# Patient Record
Sex: Male | Born: 1985 | Race: White | Hispanic: Yes | Marital: Married | Smoking: Current some day smoker
Health system: Southern US, Community
[De-identification: ages and names within clinical notes are randomized; demographics above are authoritative.]

## PROBLEM LIST (undated history)

## (undated) DIAGNOSIS — W3400XA Accidental discharge from unspecified firearms or gun, initial encounter: Secondary | ICD-10-CM

## (undated) DIAGNOSIS — T148XXA Other injury of unspecified body region, initial encounter: Secondary | ICD-10-CM

---

## 2007-05-20 ENCOUNTER — Inpatient Hospital Stay (HOSPITAL_COMMUNITY): Admission: EM | Admit: 2007-05-20 | Discharge: 2007-05-22 | Payer: Self-pay

## 2007-06-23 ENCOUNTER — Emergency Department (HOSPITAL_COMMUNITY): Admission: EM | Admit: 2007-06-23 | Discharge: 2007-06-23 | Payer: Self-pay | Admitting: *Deleted
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
## 2011-04-13 LAB — URINALYSIS, ROUTINE W REFLEX MICROSCOPIC
Bilirubin Urine: NEGATIVE
Glucose, UA: NEGATIVE
Hgb urine dipstick: NEGATIVE
Ketones, ur: NEGATIVE
Nitrite: NEGATIVE
Protein, ur: NEGATIVE
Specific Gravity, Urine: >1.046 — ABNORMAL HIGH
Urobilinogen, UA: 0.2
pH: 5.5

## 2011-04-13 LAB — TYPE AND SCREEN: Antibody Screen: NEGATIVE

## 2011-04-13 LAB — POCT I-STAT CREATININE: Creatinine, Ser: 1.1

## 2011-04-13 LAB — CBC
HCT: 44
HCT: 48.8
MCHC: 34.7
MCV: 88.2
MCV: 89.3
Platelets: 242
Platelets: 252
RBC: 5.53
WBC: 12.5 — ABNORMAL HIGH

## 2011-04-13 LAB — I-STAT 8, (EC8 V) (CONVERTED LAB)
Acid-base deficit: 3 — ABNORMAL HIGH
Bicarbonate: 21.2
HCT: 53 — ABNORMAL HIGH
Operator id: 284251
Sodium: 140
TCO2: 22

## 2011-04-13 LAB — BASIC METABOLIC PANEL
Chloride: 104
Sodium: 139

## 2011-04-13 LAB — ABO/RH: ABO/RH(D): O POS

## 2012-10-03 ENCOUNTER — Emergency Department (HOSPITAL_COMMUNITY)
Admission: EM | Admit: 2012-10-03 | Discharge: 2012-10-03 | Disposition: A | Discharge status: Home / Self Care | Payer: Self-pay | Attending: Emergency Medicine | Admitting: Emergency Medicine

## 2012-10-03 ENCOUNTER — Encounter (HOSPITAL_COMMUNITY): Payer: Self-pay | Admitting: Emergency Medicine

## 2012-10-03 DIAGNOSIS — Z87828 Personal history of other (healed) physical injury and trauma: Secondary | ICD-10-CM | POA: Insufficient documentation

## 2012-10-03 DIAGNOSIS — F10929 Alcohol use, unspecified with intoxication, unspecified: Secondary | ICD-10-CM

## 2012-10-03 DIAGNOSIS — F1010 Alcohol abuse, uncomplicated: Secondary | ICD-10-CM | POA: Insufficient documentation

## 2012-10-03 HISTORY — DX: Other injury of unspecified body region, initial encounter: T14.8XXA

## 2012-10-03 HISTORY — DX: Accidental discharge from unspecified firearms or gun, initial encounter: W34.00XA

## 2012-10-03 LAB — BASIC METABOLIC PANEL
Calcium: 9.1 mg/dL (ref 8.4–10.5)
Chloride: 108 mEq/L (ref 96–112)
Creatinine, Ser: 0.57 mg/dL (ref 0.50–1.35)
GFR calc Af Amer: >90 mL/min (ref >90)
GFR calc non Af Amer: >90 mL/min (ref >90)
Sodium: 144 mEq/L (ref 135–145)

## 2012-10-03 LAB — CBC
HCT: 43.9 % (ref 39.0–52.0)
Hemoglobin: 14.8 g/dL (ref 13.0–17.0)
MCH: 31.3 pg (ref 26.0–34.0)
MCHC: 33.7 g/dL (ref 30.0–36.0)
MCV: 92.8 fL (ref 78.0–100.0)
Platelets: 361 10*3/uL (ref 150–400)
RDW: 13.7 % (ref 11.5–15.5)
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
## 2012-10-03 NOTE — ED Provider Notes (Signed)
Medical screening examination/treatment/procedure(s) were conducted as a shared visit with non-physician practitioner(s) and myself.  I personally evaluated the patient during the encounter  Pt is ambulating without assistance. No slurred speech or clinical signs of intoxication.   Loren Racer, MD 10/03/12 2312
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
## 2012-10-03 NOTE — ED Notes (Signed)
MD at bedside. 
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
## 2012-10-03 NOTE — ED Notes (Signed)
IV taken out, put in by EMS.

## 2012-10-03 NOTE — ED Notes (Signed)
[id number]<AV> Expected date:<BR> Expected time:<BR> Means of arrival:<BR> Comments:<BR> ETOH
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
## 2012-10-03 NOTE — ED Notes (Signed)
Pt lying supine. Breaths even/unlabored. No acute distress.

## 2012-10-03 NOTE — Progress Notes (Signed)
Pt flagged Cm in hallway to speak with a male on his cell phone to tell male address to hospital [address] Male states he will be at hospital in 5-10 minutes and will call pt back. ED RN updated  Cm noted Jeffery Patterson,Jeffery Patterson   [phone] listed in demographics Male answered and stated CM had wrong number when CM request to speak with Jeffery Patterson for Jeffery Patterson.

## 2018-02-09 ENCOUNTER — Emergency Department (HOSPITAL_COMMUNITY): Payer: Self-pay

## 2018-02-09 ENCOUNTER — Emergency Department (HOSPITAL_COMMUNITY)
Admission: EM | Admit: 2018-02-09 | Discharge: 2018-02-10 | Disposition: A | Discharge status: Home / Self Care | Payer: Self-pay | Attending: Emergency Medicine | Admitting: Emergency Medicine

## 2018-02-09 ENCOUNTER — Other Ambulatory Visit: Payer: Self-pay

## 2018-02-09 DIAGNOSIS — R0789 Other chest pain: Secondary | ICD-10-CM | POA: Insufficient documentation

## 2018-02-09 DIAGNOSIS — R079 Chest pain, unspecified: Secondary | ICD-10-CM

## 2018-02-09 DIAGNOSIS — J029 Acute pharyngitis, unspecified: Secondary | ICD-10-CM | POA: Insufficient documentation

## 2018-02-09 DIAGNOSIS — M542 Cervicalgia: Secondary | ICD-10-CM | POA: Insufficient documentation

## 2018-02-09 LAB — COMPREHENSIVE METABOLIC PANEL
ALBUMIN: 4.2 g/dL (ref 3.5–5.0)
ALT: 47 U/L — ABNORMAL HIGH (ref 0–44)
ANION GAP: 14 (ref 5–15)
AST: 34 U/L (ref 15–41)
Alkaline Phosphatase: 47 U/L (ref 38–126)
BUN: 6 mg/dL (ref 6–20)
CHLORIDE: 103 mmol/L (ref 98–111)
CO2: 22 mmol/L (ref 22–32)
Calcium: 9.2 mg/dL (ref 8.9–10.3)
Creatinine, Ser: 0.8 mg/dL (ref 0.61–1.24)
GFR calc non Af Amer: >60 mL/min (ref >60)
GLUCOSE: 104 mg/dL — AB (ref 70–99)
Potassium: 3.6 mmol/L (ref 3.5–5.1)
Sodium: 139 mmol/L (ref 135–145)
Total Bilirubin: 1.2 mg/dL (ref 0.3–1.2)
Total Protein: 6.9 g/dL (ref 6.5–8.1)

## 2018-02-09 LAB — CBC
HEMATOCRIT: 45.3 % (ref 39.0–52.0)
HEMOGLOBIN: 15.1 g/dL (ref 13.0–17.0)
MCH: 31.3 pg (ref 26.0–34.0)
MCHC: 33.3 g/dL (ref 30.0–36.0)
MCV: 94 fL (ref 78.0–100.0)
Platelets: 280 10*3/uL (ref 150–400)
RBC: 4.82 MIL/uL (ref 4.22–5.81)
RDW: 11.9 % (ref 11.5–15.5)
WBC: 17.3 10*3/uL — ABNORMAL HIGH (ref 4.0–10.5)

## 2018-02-09 LAB — URINALYSIS, ROUTINE W REFLEX MICROSCOPIC
Bilirubin Urine: NEGATIVE
GLUCOSE, UA: NEGATIVE mg/dL
Hgb urine dipstick: NEGATIVE
Ketones, ur: NEGATIVE mg/dL
Leukocytes, UA: NEGATIVE
Nitrite: NEGATIVE
PH: 7 (ref 5.0–8.0)
Protein, ur: NEGATIVE mg/dL
SPECIFIC GRAVITY, URINE: 1.002 — AB (ref 1.005–1.030)

## 2018-02-09 LAB — I-STAT CG4 LACTIC ACID, ED: Lactic Acid, Venous: 1.84 mmol/L (ref 0.5–1.9)

## 2018-02-09 LAB — I-STAT TROPONIN, ED: Troponin i, poc: 0 ng/mL (ref 0.00–0.08)

## 2018-02-09 LAB — LIPASE, BLOOD: LIPASE: 28 U/L (ref 11–51)

## 2018-02-09 MED ORDER — SODIUM CHLORIDE 0.9 % IV BOLUS
1000.0000 mL | Freq: Once | INTRAVENOUS | Status: AC
  Administered 2018-02-09: 1000 mL via INTRAVENOUS

## 2018-02-09 MED ORDER — ACETAMINOPHEN 325 MG PO TABS
650.0000 mg | ORAL_TABLET | Freq: Once | ORAL | Status: AC
  Administered 2018-02-09: 650 mg via ORAL
  Filled 2018-02-09: qty 2

## 2018-02-09 MED ORDER — IOHEXOL 300 MG/ML  SOLN
100.0000 mL | Freq: Once | INTRAMUSCULAR | Status: AC | PRN
  Administered 2018-02-09: 100 mL via INTRAVENOUS

## 2018-02-09 NOTE — ED Notes (Signed)
Patient transported to CT 

## 2018-02-09 NOTE — ED Notes (Signed)
Writer sent down urine culture with U/A

## 2018-02-09 NOTE — ED Triage Notes (Signed)
Pt speaks Spanish. Pt reports pain to R jaw radiating to head. Pt reports vomiting for 2 days. Pt feels weak. Pt denies abdominal pain. Pt reports loss of appetite.

## 2018-02-09 NOTE — ED Notes (Signed)
Pt up to desk, states she is feeling more weak and dizzy.  Due to patient's elevated WBC, will prioritize rooming for patient.
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
## 2018-02-09 NOTE — ED Notes (Signed)
ED Provider at bedside. 

## 2018-02-10 LAB — D-DIMER, QUANTITATIVE (NOT AT ARMC)

## 2018-02-10 LAB — I-STAT TROPONIN, ED: TROPONIN I, POC: 0 ng/mL (ref 0.00–0.08)

## 2018-02-10 LAB — GROUP A STREP BY PCR: GROUP A STREP BY PCR: NOT DETECTED

## 2018-02-10 MED ORDER — CLINDAMYCIN HCL 150 MG PO CAPS: 150.0000 mg | ORAL_CAPSULE | Freq: Four times a day (QID) | ORAL | 0 refills | Status: AC

## 2018-02-10 MED ORDER — CLINDAMYCIN HCL 150 MG PO CAPS
300.0000 mg | ORAL_CAPSULE | Freq: Once | ORAL | Status: AC
  Administered 2018-02-10: 300 mg via ORAL
  Filled 2018-02-10: qty 2

## 2018-02-10 NOTE — ED Notes (Signed)
ED Provider at bedside. 

## 2020-03-21 IMAGING — CT CT NECK W/ CM
4 series · 15 of 33 positions shown, 18 images · IV contrast (Omni 300)
Comparison: None.

CLINICAL DATA: Vomiting and right-sided jaw pain radiating to the
head.

EXAM:
CT NECK WITH CONTRAST
TECHNIQUE: Multidetector CT imaging of the neck was performed using the
standard protocol following the bolus administration of intravenous
contrast.
CONTRAST:  100mL OMNIPAQUE IOHEXOL 300 MG/ML  SOLN

[Series 3: neck 2.0 st · axial · 0.46mm/px · z∈[-297,-109]mm · 5 of 142 slices shown, 7 images (1 of 3)]
[im 24/142  soft-tissue]
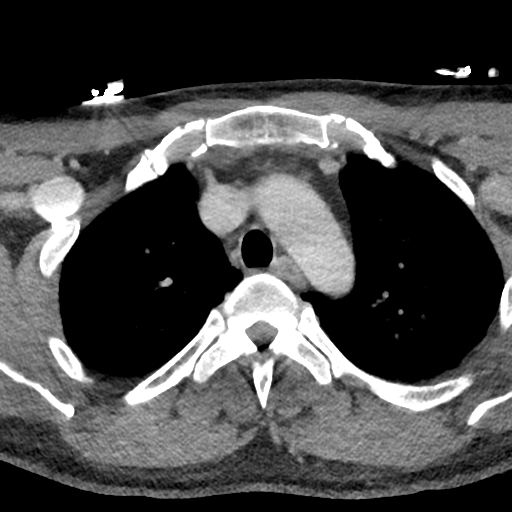
[im 24/142  bone]
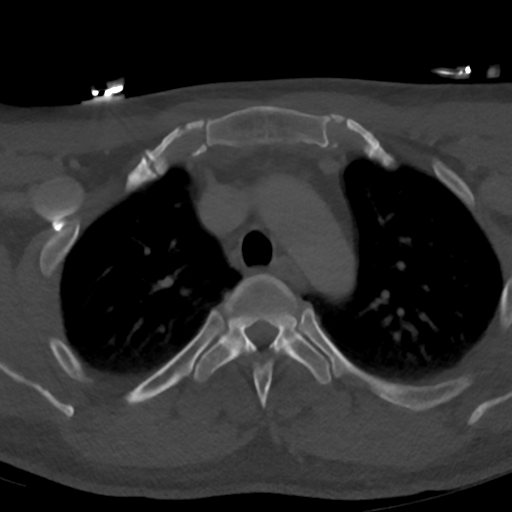
[im 48/142  bone]
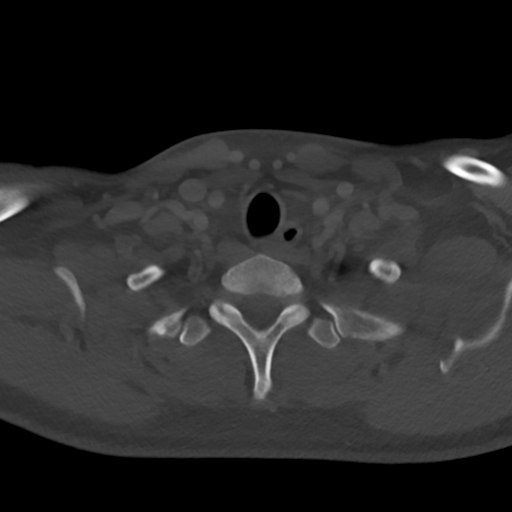
[im 71/142  bone]
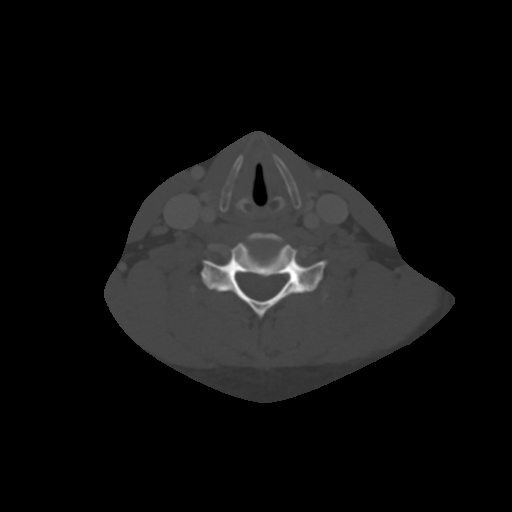
[im 95/142  bone]
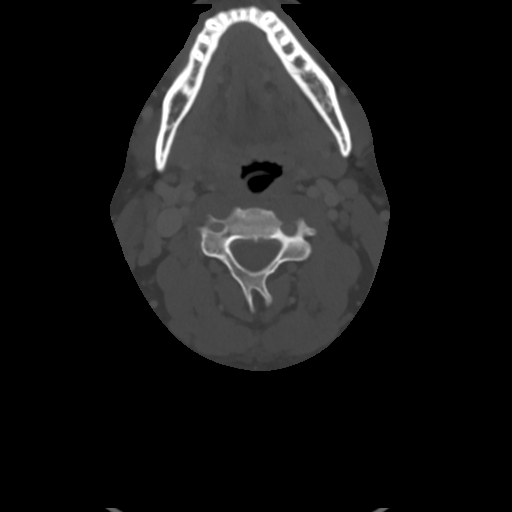
[im 118/142  soft-tissue]
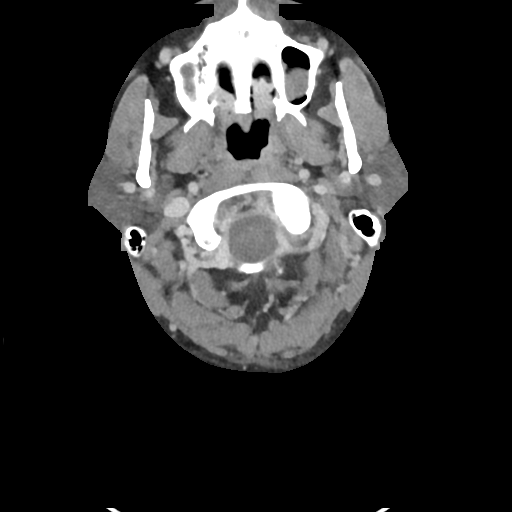
[im 118/142  bone]
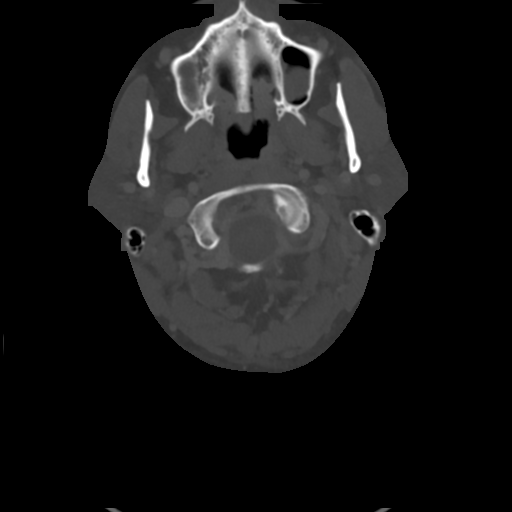

[Series 5: neck 2.0 st · sagittal · 0.55mm/px · 5 of 101 slices shown, 6 images (2 of 3)]
[im 34/101  bone]
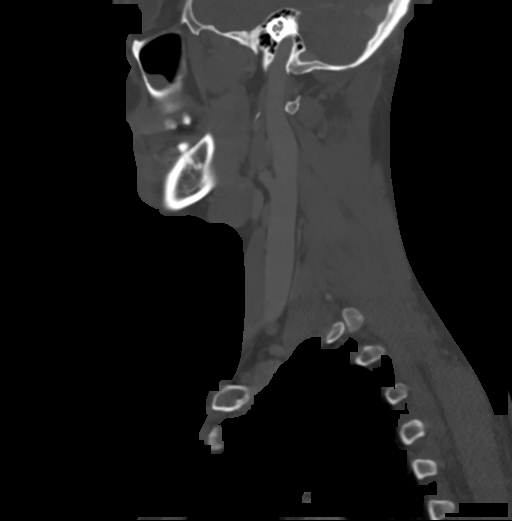
[im 42/101  bone]
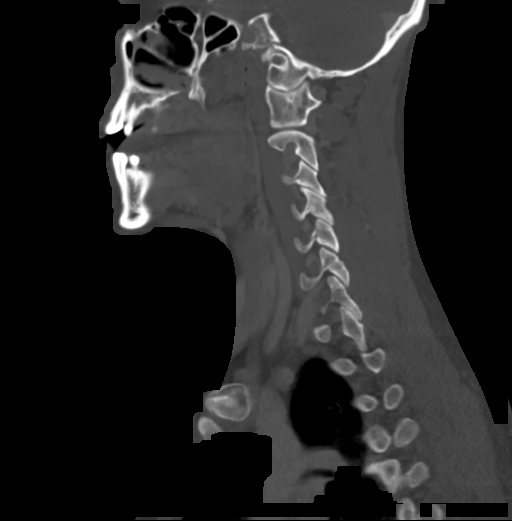
[im 51/101  soft-tissue]
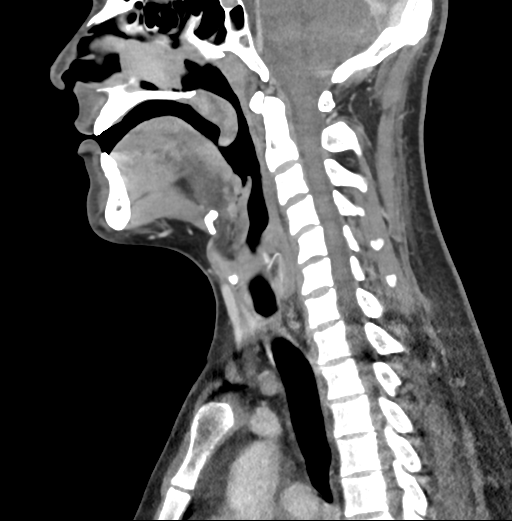
[im 51/101  bone]
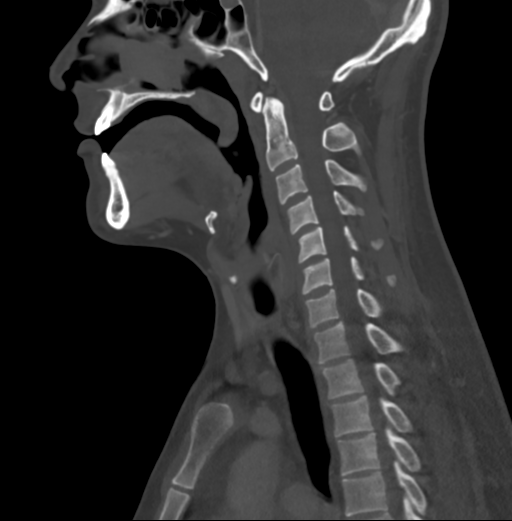
[im 59/101  bone]
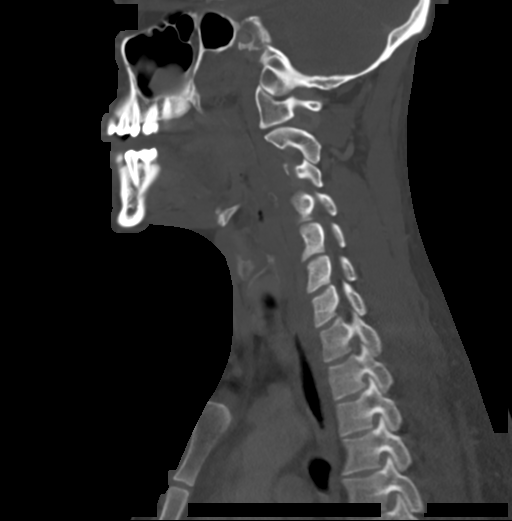
[im 67/101  bone]
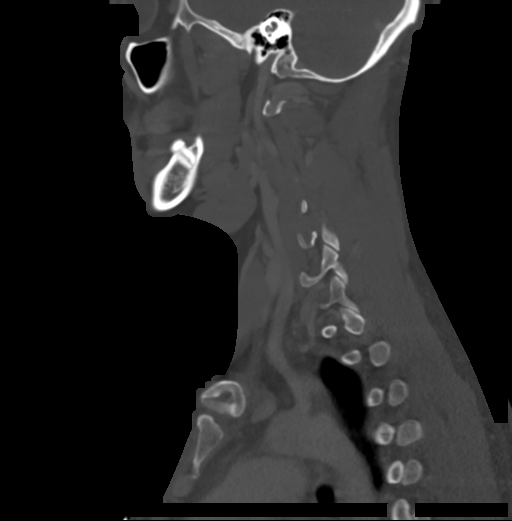

[Series 6: neck 2.0 st · coronal · 0.56mm/px · 3 of 124 slices shown (3 of 3)]
[im 25/124  bone]
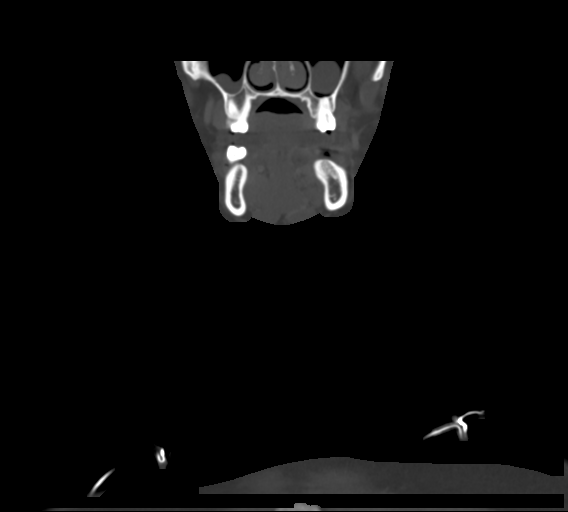
[im 50/124  bone]
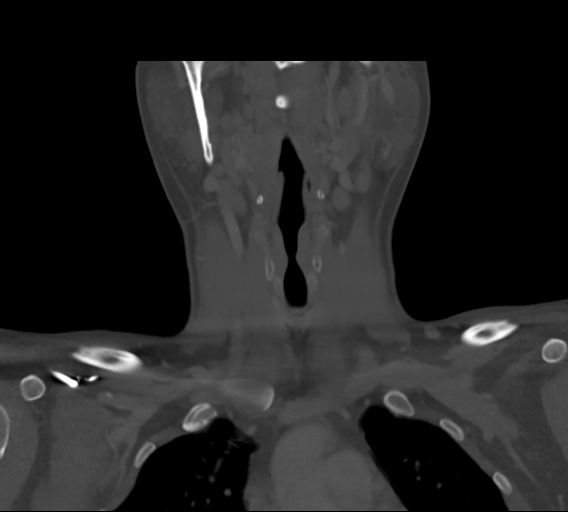
[im 74/124  bone]
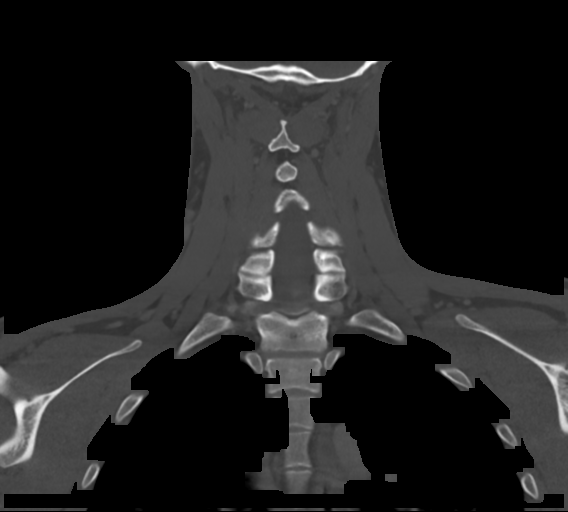

[Series 7: neck 2.0 st orthogonal · axial · 0.39mm/px · z∈[-291,-245]mm · 2 of 141 slices shown]
[im 24/141  bone]
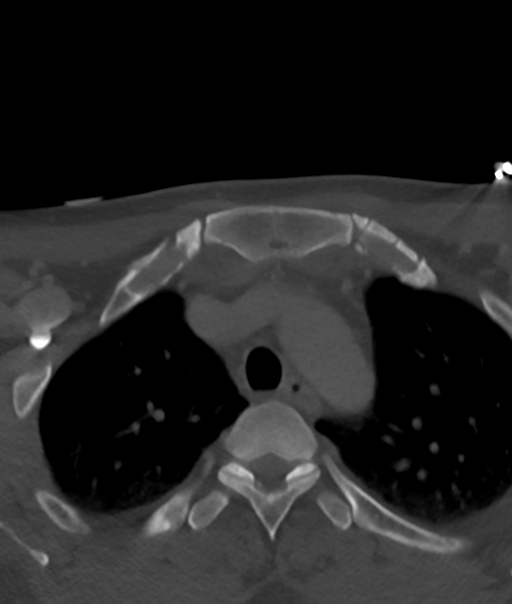
[im 47/141  bone]
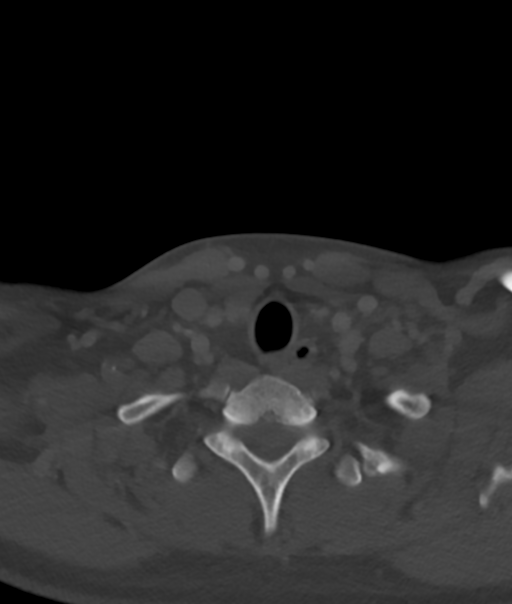

[15 of 33 positions shown; findings below may reference images not displayed]

FINDINGS: PHARYNX AND LARYNX:

--Nasopharynx: Mildly enlarged adenoid tonsils.

--Oral cavity and oropharynx: Right-greater-than-left palatine
tonsillar enlargement and moderate enlargement of the lingual
tonsils. Unremarkable oral cavity. No peritonsillar abscess or fluid
collection.

--Hypopharynx: Normal vallecula and pyriform sinuses.

--Larynx: Normal epiglottis and pre-epiglottic space. Normal
aryepiglottic and vocal folds.

--Retropharyngeal space: No abscess, effusion or lymphadenopathy.

SALIVARY GLANDS:

--Parotid: No mass lesion or inflammation. No sialolithiasis or
ductal dilatation.

--Submandibular: Symmetric without inflammation. No sialolithiasis
or ductal dilatation.

--Sublingual: Normal. No ranula or other visible lesion of the base
of tongue and floor of mouth.

THYROID: Normal.

LYMPH NODES: Left level 2A nodes measure up to 9 mm. Right level 2A
nodes also measure up to 9 mm. No abnormal density nodes.

VASCULAR: Major cervical vessels are patent.

LIMITED INTRACRANIAL: Normal.

VISUALIZED ORBITS: Normal.

MASTOIDS AND VISUALIZED PARANASAL SINUSES: No fluid levels or
advanced mucosal thickening. No mastoid effusion.

SKELETON: No bony spinal canal stenosis. No lytic or blastic
lesions.

UPPER CHEST: Clear.

OTHER: None.
IMPRESSION: Acute tonsillopharyngitis without peritonsillar abscess or fluid
collection. Mild reactive upper cervical lymphadenopathy.

## 2021-10-21 ENCOUNTER — Encounter (HOSPITAL_COMMUNITY): Payer: Self-pay

## 2021-10-21 ENCOUNTER — Inpatient Hospital Stay (HOSPITAL_COMMUNITY)
Admission: EM | Admit: 2021-10-21 | Discharge: 2021-10-23 | DRG: 347 | Disposition: A | Discharge status: Home / Self Care | Payer: Self-pay | Source: Ambulatory Visit | Attending: General Surgery | Admitting: General Surgery

## 2021-10-21 ENCOUNTER — Ambulatory Visit (HOSPITAL_COMMUNITY)
Admission: EM | Admit: 2021-10-21 | Discharge: 2021-10-21 | Disposition: A | Discharge status: Home / Self Care | Payer: Self-pay

## 2021-10-21 ENCOUNTER — Emergency Department (HOSPITAL_COMMUNITY): Payer: Self-pay

## 2021-10-21 ENCOUNTER — Other Ambulatory Visit: Payer: Self-pay

## 2021-10-21 DIAGNOSIS — F17210 Nicotine dependence, cigarettes, uncomplicated: Secondary | ICD-10-CM | POA: Diagnosis present

## 2021-10-21 DIAGNOSIS — K651 Peritoneal abscess: Secondary | ICD-10-CM | POA: Diagnosis present

## 2021-10-21 DIAGNOSIS — K612 Anorectal abscess: Secondary | ICD-10-CM

## 2021-10-21 DIAGNOSIS — K610 Anal abscess: Secondary | ICD-10-CM | POA: Diagnosis present

## 2021-10-21 DIAGNOSIS — L0291 Cutaneous abscess, unspecified: Principal | ICD-10-CM

## 2021-10-21 LAB — BASIC METABOLIC PANEL
Anion gap: 7 (ref 5–15)
BUN: 11 mg/dL (ref 6–20)
CO2: 27 mmol/L (ref 22–32)
Calcium: 8.9 mg/dL (ref 8.9–10.3)
Chloride: 105 mmol/L (ref 98–111)
Creatinine, Ser: 0.88 mg/dL (ref 0.61–1.24)
GFR, Estimated: >60 mL/min (ref >60)
Glucose, Bld: 111 mg/dL — ABNORMAL HIGH (ref 70–99)
Potassium: 4.4 mmol/L (ref 3.5–5.1)
Sodium: 139 mmol/L (ref 135–145)

## 2021-10-21 LAB — LIPASE, BLOOD: Lipase: 29 U/L (ref 11–51)

## 2021-10-21 LAB — CBC
HCT: 44 % (ref 39.0–52.0)
Hemoglobin: 14.7 g/dL (ref 13.0–17.0)
MCH: 31.5 pg (ref 26.0–34.0)
MCHC: 33.4 g/dL (ref 30.0–36.0)
MCV: 94.4 fL (ref 80.0–100.0)
Platelets: 380 10*3/uL (ref 150–400)
RBC: 4.66 MIL/uL (ref 4.22–5.81)
RDW: 12.3 % (ref 11.5–15.5)
WBC: 18.2 10*3/uL — ABNORMAL HIGH (ref 4.0–10.5)
nRBC: 0 % (ref 0.0–0.2)

## 2021-10-21 MED ORDER — DOCUSATE SODIUM 100 MG PO CAPS
100.0000 mg | ORAL_CAPSULE | Freq: Two times a day (BID) | ORAL | Status: DC
  Administered 2021-10-22 – 2021-10-23 (×2): 100 mg via ORAL
  Filled 2021-10-21 (×2): qty 1

## 2021-10-21 MED ORDER — METRONIDAZOLE 500 MG/100ML IV SOLN
500.0000 mg | Freq: Two times a day (BID) | INTRAVENOUS | Status: DC
  Administered 2021-10-21 – 2021-10-23 (×3): 500 mg via INTRAVENOUS
  Filled 2021-10-21 (×4): qty 100

## 2021-10-21 MED ORDER — LACTATED RINGERS IV SOLN: INTRAVENOUS | Status: DC

## 2021-10-21 MED ORDER — SODIUM CHLORIDE 0.9 % IV BOLUS
1000.0000 mL | Freq: Once | INTRAVENOUS | Status: AC
  Administered 2021-10-21: 1000 mL via INTRAVENOUS

## 2021-10-21 MED ORDER — ONDANSETRON HCL 4 MG/2ML IJ SOLN: 4.0000 mg | Freq: Four times a day (QID) | INTRAMUSCULAR | Status: DC | PRN

## 2021-10-21 MED ORDER — IOHEXOL 300 MG/ML  SOLN
90.0000 mL | Freq: Once | INTRAMUSCULAR | Status: AC | PRN
  Administered 2021-10-21: 90 mL via INTRAVENOUS

## 2021-10-21 MED ORDER — DIPHENHYDRAMINE HCL 25 MG PO CAPS: 25.0000 mg | ORAL_CAPSULE | Freq: Four times a day (QID) | ORAL | Status: DC | PRN

## 2021-10-21 MED ORDER — SODIUM CHLORIDE 0.9 % IV SOLN
2.0000 g | INTRAVENOUS | Status: DC
  Administered 2021-10-21 – 2021-10-22 (×2): 2 g via INTRAVENOUS
  Filled 2021-10-21 (×2): qty 20

## 2021-10-21 MED ORDER — HYDROMORPHONE HCL 1 MG/ML IJ SOLN: 0.5000 mg | INTRAMUSCULAR | Status: DC | PRN

## 2021-10-21 MED ORDER — ACETAMINOPHEN 500 MG PO TABS
1000.0000 mg | ORAL_TABLET | Freq: Four times a day (QID) | ORAL | Status: DC
  Administered 2021-10-21 – 2021-10-23 (×7): 1000 mg via ORAL
  Filled 2021-10-21 (×7): qty 2

## 2021-10-21 MED ORDER — OXYCODONE HCL 5 MG PO TABS
5.0000 mg | ORAL_TABLET | ORAL | Status: DC | PRN
  Administered 2021-10-22: 5 mg via ORAL
  Filled 2021-10-21: qty 1

## 2021-10-21 MED ORDER — MORPHINE SULFATE (PF) 4 MG/ML IV SOLN
4.0000 mg | Freq: Once | INTRAVENOUS | Status: AC
  Administered 2021-10-21: 4 mg via INTRAVENOUS
  Filled 2021-10-21: qty 1

## 2021-10-21 MED ORDER — KETOROLAC TROMETHAMINE 30 MG/ML IJ SOLN
15.0000 mg | Freq: Four times a day (QID) | INTRAMUSCULAR | Status: DC
  Administered 2021-10-21 – 2021-10-22 (×3): 15 mg via INTRAVENOUS
  Filled 2021-10-21 (×3): qty 1

## 2021-10-21 MED ORDER — ONDANSETRON 4 MG PO TBDP: 4.0000 mg | ORAL_TABLET | Freq: Four times a day (QID) | ORAL | Status: DC | PRN

## 2021-10-21 MED ORDER — ENOXAPARIN SODIUM 40 MG/0.4ML IJ SOSY
40.0000 mg | PREFILLED_SYRINGE | INTRAMUSCULAR | Status: DC
  Administered 2021-10-21 – 2021-10-22 (×2): 40 mg via SUBCUTANEOUS
  Filled 2021-10-21 (×2): qty 0.4

## 2021-10-21 MED ORDER — DIPHENHYDRAMINE HCL 50 MG/ML IJ SOLN: 25.0000 mg | Freq: Four times a day (QID) | INTRAMUSCULAR | Status: DC | PRN
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
## 2021-10-21 NOTE — ED Provider Triage Note (Signed)
Emergency Medicine Provider Triage Evaluation Note ? ?Jeffery Patterson , a 36 y.o. male  was evaluated in triage.  Pt complains of 2-week history of pain to his gluteal region.  Says it is always there however worse when he is trying to have a bowel movement.  Says that the area is not draining, has not had fevers or chills.  This is never happened before. ? ?Sent from urgent care for complicated abscess ? ?Review of Systems  ?Positive: As above ?Negative: As above ? ?Physical Exam  ?BP 122/80 (BP Location: Right Arm)   Pulse 78   Temp 98.2 ?F (36.8 ?C)   Resp 17   Ht 5' 3.78" (1.62 m)   Wt 84.4 kg   SpO2 100%   BMI 32.15 kg/m?  ?Gen:   Awake, no distress   ?Resp:  Normal effort  ?MSK:   Moves extremities without difficulty  ?Other:  Induration to the left gluteal cleft, just above the anus.  Warm and fluctuant.  Observed in presence of RN ? ?Medical Decision Making  ?Medically screening exam initiated at 12:56 PM.  Appropriate orders placed.  Jeffery Patterson was informed that the remainder of the evaluation will be completed by another provider, this initial triage assessment does not replace that evaluation, and the importance of remaining in the ED until their evaluation is complete. ? ? ? ? ?  ?Saddie Benders, PA-C ?10/21/21 1258 ? ?

## 2021-10-21 NOTE — ED Provider Notes (Signed)
?[facility] EMERGENCY DEPARTMENT ?Provider Note ? ? ?CSN: [id number] ?Arrival date & time: 10/21/21  1129 ? ?  ? ?History ? ?Chief Complaint  ?Patient presents with  ? Abscess  ? ? ?Jeffery Patterson is a 36 y.o. male. ? ?36 y.o male with no PMH presents to the ED with a chief complaint of rectal pain x 2 weeks. Patient states first noticing a small bump to his rectal area, this has began to grow in the past two weeks. He has been applying vaselin, vitacillin (over the counter ointment) without any improvement in symptoms. There is pain to the area exacerbate with ambulating and sitting down. He also endorses decrease in p.o. intake, he does feel some pain with defecation therefore his try to decrease his intake.  Also endorsing pain with coughing.  Has had regular bowel movements without any blood in his stool.  No fever, no nausea, no vomiting, no abdominal pain.  No prior history of this.  No prior history of anal intercourse.  ? ? ?The history is provided by the patient.  ?Abscess ?Location:  Pelvis ?Pelvic abscess location:  Anus ?Associated symptoms: nausea   ?Associated symptoms: no fever, no headaches and no vomiting   ? ?  ? ?Home Medications ?Prior to Admission medications   ?Not on File  ?   ? ?Allergies    ?Patient has no known allergies.   ? ?Review of Systems   ?Review of Systems  ?Constitutional:  Negative for chills and fever.  ?Respiratory:  Negative for shortness of breath.   ?Cardiovascular:  Negative for chest pain.  ?Gastrointestinal:  Positive for nausea and rectal pain. Negative for abdominal pain, constipation, diarrhea and vomiting.  ?Genitourinary:  Negative for flank pain.  ?Musculoskeletal:  Negative for back pain.  ?Skin:  Negative for pallor and wound.  ?Neurological:  Negative for headaches.  ?All other systems reviewed and are negative. ? ?Physical Exam ?Updated Vital Signs ?BP 116/70   Pulse 85   Temp 98.2 ?F (36.8 ?C)   Resp 18   Ht 5' 3.78" (1.62 m)   Wt 84.4 kg    SpO2 98%   BMI 32.15 kg/m?  ?Physical Exam ?Vitals and nursing note reviewed. Exam conducted with a chaperone present.  ?Constitutional:   ?   Appearance: Normal appearance.  ?HENT:  ?   Head: Normocephalic and atraumatic.  ?Eyes:  ?   Pupils: Pupils are equal, round, and reactive to light.  ?Cardiovascular:  ?   Rate and Rhythm: Normal rate.  ?Pulmonary:  ?   Effort: Pulmonary effort is normal.  ?Abdominal:  ?   General: Abdomen is flat.  ?Genitourinary: ?   Rectum: Tenderness present.  ? ? ?   Comments: Chaperoned by Callie Fielding NT,  ?Musculoskeletal:  ?   Cervical back: Normal range of motion and neck supple.  ?Skin: ?   General: Skin is warm and dry.  ?Neurological:  ?   Mental Status: He is alert and oriented to person, place, and time.  ? ? ?ED Results / Procedures / Treatments   ?Labs ?(all labs ordered are listed, but only abnormal results are displayed) ?Labs Reviewed  ?CBC - Abnormal; Notable for the following components:  ?    Result Value  ? WBC 18.2 (*)   ? All other components within normal limits  ?BASIC METABOLIC PANEL - Abnormal; Notable for the following components:  ? Glucose, Bld 111 (*)   ? All other components within normal limits  ?LIPASE,  BLOOD  ?HIV ANTIBODY (ROUTINE TESTING W REFLEX)  ? ? ?EKG ?None ? ?Radiology ?CT PELVIS W CONTRAST ? ?Result Date: 10/21/2021 ?CLINICAL DATA:  Two week history of pain in gluteal region. Worse with bowel movement. Anorectal abscess. EXAM: CT PELVIS WITH CONTRAST TECHNIQUE: Multidetector CT imaging of the pelvis was performed using the standard protocol following the bolus administration of intravenous contrast. RADIATION DOSE REDUCTION: This exam was performed according to the departmental dose-optimization program which includes automated exposure control, adjustment of the mA and/or kV according to patient size and/or use of iterative reconstruction technique. CONTRAST:  41mL OMNIPAQUE IOHEXOL 300 MG/ML  SOLN COMPARISON:  Report 05/20/2007 FINDINGS: Urinary  Tract:  No distal hydroureter.  Normal urinary bladder. Bowel: Minimal motion degradation superiorly. Normal small bowel, imaged terminal ileum, and imaged appendix. Moderate amount of stool in the rectum. No evidence of colitis or proctitis. An incompletely imaged peripherally enhancing fluid collection along the left gluteal crease measures 1.5 x 2.8 cm on 144/3. At least 2.6 cm craniocaudal on coronal image 84 (incompletely imaged). Likely tracks from the 12 to 1 o'clock position of the low anus including on 130/3 Vascular/Lymphatic: No pelvic aneurysm or sidewall adenopathy. Reproductive:  Normal prostate. Other:  No significant free fluid.  No free intraperitoneal air. Musculoskeletal: Probable bone island in the left iliac. Transitional S1 vertebral body. IMPRESSION: 1. 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 The imaging test of choice for further characterization is nonemergent dedicated high-resolution pre and post-contrast MRI. 2. No findings of colitis or proctitis.  Possible constipation. Electronically Signed   By: Jeronimo Greaves M.D.   On: 10/21/2021 18:09   ? ?Procedures ?Procedures  ? ?Medications Ordered in ED ?Medications  ?enoxaparin (LOVENOX) injection 40 mg (has no administration in time range)  ?lactated ringers infusion (has no administration in time range)  ?metroNIDAZOLE (FLAGYL) IVPB 500 mg (has no administration in time range)  ?cefTRIAXone (ROCEPHIN) 2 g in sodium chloride 0.9 % 100 mL IVPB (has no administration in time range)  ?acetaminophen (TYLENOL) tablet 1,000 mg (has no administration in time range)  ?ketorolac (TORADOL) 30 MG/ML injection 15 mg (has no administration in time range)  ?HYDROmorphone (DILAUDID) injection 0.5 mg (has no administration in time range)  ?oxyCODONE (Oxy IR/ROXICODONE) immediate release tablet 5-10 mg (has no administration in time  range)  ?docusate sodium (COLACE) capsule 100 mg (has no administration in time range)  ?diphenhydrAMINE (BENADRYL) capsule 25 mg (has no administration in time range)  ?  Or  ?diphenhydrAMINE (BENADRYL) injection 25 mg (has no administration in time range)  ?ondansetron (ZOFRAN-ODT) disintegrating tablet 4 mg (has no administration in time range)  ?  Or  ?ondansetron (ZOFRAN) injection 4 mg (has no administration in time range)  ?sodium chloride 0.9 % bolus 1,000 mL (1,000 mLs Intravenous New Bag/Given 10/21/21 1709)  ?morphine (PF) 4 MG/ML injection 4 mg (4 mg Intravenous Given 10/21/21 1710)  ?iohexol (OMNIPAQUE) 300 MG/ML solution 90 mL (90 mLs Intravenous Contrast Given 10/21/21 1755)  ? ? ?ED Course/ Medical Decision Making/ A&P ?  ?                        ?Medical Decision Making ?Amount and/or Complexity of Data Reviewed ?Radiology: ordered. ? ?Risk ?Prescription drug management. ? ? ?This patient presents to the ED for concern of rectal pain, this involves  a number of treatment options, and is a complaint that carries with it a high risk of complications and morbidity.  The differential diagnosis includes perirectal abscess, rectal fissure versus trauma.  ? ? ?Co morbidities: ?Discussed in HPI ? ? ?Brief History: ? ?Patient here with rectal pain for the past 2 weeks, worsening in nature, exacerbated with sitting, ambulation.  No improvement despite Vaseline, over-the-counter antibiotic ointment.  No fevers, does endorse decrease in oral intake due to ongoing pain with defecation.  Normal bowel movements at home without any blood in his stool. ? ?EMR reviewed including pt PMHx, past surgical history and past visits to ER.  ? ?See HPI for more details ? ? ?Lab Tests: ? ?I ordered and independently interpreted labs.  The pertinent results include:   ? ?Labs notable for interpretation of CBC with leukocytosis of 18.2, hemoglobin is within normal limits. BMP with no electrolyte derangement, current levels within  normal limits. ? ? ?Imaging Studies: ? ?CT pelvis showed: ?1. 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

## 2021-10-21 NOTE — ED Triage Notes (Signed)
Pt arrived POV from home c/o a bump on his butt on the left side that is really painful. Pt states he first noticed it 2 weeks ago. Pt states it is getting bigger.  ?

## 2021-10-21 NOTE — ED Triage Notes (Signed)
2 wk h/o "bump" in buttocks area that has increased in size since the onset. Started using Bicillin like cream yesterday. Pt is ambulating with a limp due to pain. No meds taken. ?

## 2021-10-21 NOTE — ED Notes (Signed)
Patient transported to CT 

## 2021-10-21 NOTE — ED Notes (Signed)
Patient is being discharged from the Urgent Care and sent to the Emergency Department via POV . Per Stevie Kern, NP, patient is in need of higher level of care due to evaluation. Patient is aware and verbalizes understanding of plan of care.  ?Vitals:  ? 10/21/21 1040  ?BP: 127/61  ?Pulse: 72  ?Resp: 18  ?Temp: 97.9 ?F (36.6 ?C)  ?SpO2: 100%  ? ?POV ?
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
## 2021-10-21 NOTE — Discharge Instructions (Addendum)
Please go to the ER immediately

## 2021-10-22 ENCOUNTER — Observation Stay (HOSPITAL_BASED_OUTPATIENT_CLINIC_OR_DEPARTMENT_OTHER): Payer: Self-pay | Admitting: Anesthesiology

## 2021-10-22 ENCOUNTER — Encounter (HOSPITAL_COMMUNITY): Admission: EM | Disposition: A | Discharge status: Home / Self Care | Payer: Self-pay | Source: Ambulatory Visit

## 2021-10-22 ENCOUNTER — Observation Stay (HOSPITAL_COMMUNITY): Payer: Self-pay | Admitting: Anesthesiology

## 2021-10-22 ENCOUNTER — Other Ambulatory Visit: Payer: Self-pay

## 2021-10-22 ENCOUNTER — Encounter (HOSPITAL_COMMUNITY): Payer: Self-pay

## 2021-10-22 DIAGNOSIS — K610 Anal abscess: Secondary | ICD-10-CM

## 2021-10-22 HISTORY — PX: INCISION AND DRAINAGE ABSCESS: SHX5864

## 2021-10-22 LAB — HIV ANTIBODY (ROUTINE TESTING W REFLEX): HIV Screen 4th Generation wRfx: NONREACTIVE

## 2021-10-22 SURGERY — INCISION AND DRAINAGE, ABSCESS
Anesthesia: General | Site: Perineum

## 2021-10-22 MED ORDER — ACETAMINOPHEN 500 MG PO TABS
1000.0000 mg | ORAL_TABLET | Freq: Once | ORAL | Status: AC
  Administered 2021-10-22: 1000 mg via ORAL
  Filled 2021-10-22: qty 2

## 2021-10-22 MED ORDER — LACTATED RINGERS IV SOLN: INTRAVENOUS | Status: DC

## 2021-10-22 MED ORDER — MIDAZOLAM HCL 2 MG/2ML IJ SOLN
INTRAMUSCULAR | Status: DC | PRN
  Administered 2021-10-22: 2 mg via INTRAVENOUS

## 2021-10-22 MED ORDER — OXYCODONE HCL 5 MG/5ML PO SOLN: 5.0000 mg | Freq: Once | ORAL | Status: DC | PRN

## 2021-10-22 MED ORDER — EPHEDRINE 5 MG/ML INJ
INTRAVENOUS | Status: AC
  Filled 2021-10-22: qty 5

## 2021-10-22 MED ORDER — CHLORHEXIDINE GLUCONATE 0.12 % MT SOLN
OROMUCOSAL | Status: AC
  Administered 2021-10-22: 15 mL via OROMUCOSAL
  Filled 2021-10-22: qty 15

## 2021-10-22 MED ORDER — LIDOCAINE 2% (20 MG/ML) 5 ML SYRINGE
INTRAMUSCULAR | Status: AC
  Filled 2021-10-22: qty 5

## 2021-10-22 MED ORDER — FENTANYL CITRATE (PF) 100 MCG/2ML IJ SOLN: 25.0000 ug | INTRAMUSCULAR | Status: DC | PRN

## 2021-10-22 MED ORDER — LIDOCAINE 2% (20 MG/ML) 5 ML SYRINGE
INTRAMUSCULAR | Status: DC | PRN
  Administered 2021-10-22: 100 mg via INTRAVENOUS

## 2021-10-22 MED ORDER — CHLORHEXIDINE GLUCONATE 0.12 % MT SOLN: 15.0000 mL | Freq: Once | OROMUCOSAL | Status: AC

## 2021-10-22 MED ORDER — PROPOFOL 10 MG/ML IV BOLUS
INTRAVENOUS | Status: DC | PRN
  Administered 2021-10-22: 200 mg via INTRAVENOUS

## 2021-10-22 MED ORDER — MIDAZOLAM HCL 2 MG/2ML IJ SOLN
INTRAMUSCULAR | Status: AC
  Filled 2021-10-22: qty 2

## 2021-10-22 MED ORDER — ONDANSETRON HCL 4 MG/2ML IJ SOLN: 4.0000 mg | Freq: Once | INTRAMUSCULAR | Status: DC | PRN

## 2021-10-22 MED ORDER — ORAL CARE MOUTH RINSE: 15.0000 mL | Freq: Once | OROMUCOSAL | Status: AC

## 2021-10-22 MED ORDER — KETOROLAC TROMETHAMINE 30 MG/ML IJ SOLN
15.0000 mg | Freq: Three times a day (TID) | INTRAMUSCULAR | Status: DC | PRN
  Filled 2021-10-22: qty 1

## 2021-10-22 MED ORDER — 0.9 % SODIUM CHLORIDE (POUR BTL) OPTIME
TOPICAL | Status: DC | PRN
  Administered 2021-10-22: 1000 mL

## 2021-10-22 MED ORDER — PROPOFOL 10 MG/ML IV BOLUS
INTRAVENOUS | Status: AC
  Filled 2021-10-22: qty 20

## 2021-10-22 MED ORDER — OXYCODONE HCL 5 MG PO TABS: 5.0000 mg | ORAL_TABLET | Freq: Once | ORAL | Status: DC | PRN

## 2021-10-22 MED ORDER — ONDANSETRON HCL 4 MG/2ML IJ SOLN
INTRAMUSCULAR | Status: DC | PRN
  Administered 2021-10-22: 4 mg via INTRAVENOUS

## 2021-10-22 MED ORDER — EPHEDRINE SULFATE-NACL 50-0.9 MG/10ML-% IV SOSY
PREFILLED_SYRINGE | INTRAVENOUS | Status: DC | PRN
  Administered 2021-10-22: 10 mg via INTRAVENOUS

## 2021-10-22 MED ORDER — DEXAMETHASONE SODIUM PHOSPHATE 10 MG/ML IJ SOLN
INTRAMUSCULAR | Status: DC | PRN
  Administered 2021-10-22: 10 mg via INTRAVENOUS

## 2021-10-22 MED ORDER — AMISULPRIDE (ANTIEMETIC) 5 MG/2ML IV SOLN: 10.0000 mg | Freq: Once | INTRAVENOUS | Status: DC | PRN

## 2021-10-22 MED ORDER — FENTANYL CITRATE (PF) 250 MCG/5ML IJ SOLN
INTRAMUSCULAR | Status: AC
  Filled 2021-10-22: qty 5

## 2021-10-22 MED ORDER — FENTANYL CITRATE (PF) 250 MCG/5ML IJ SOLN
INTRAMUSCULAR | Status: DC | PRN
  Administered 2021-10-22 (×3): 50 ug via INTRAVENOUS
  Administered 2021-10-22: 100 ug via INTRAVENOUS

## 2021-10-22 SURGICAL SUPPLY — 27 items
BAG COUNTER SPONGE SURGICOUNT (BAG) ×2 IMPLANT
BNDG GAUZE ELAST 4 BULKY (GAUZE/BANDAGES/DRESSINGS) IMPLANT
CANISTER SUCT 3000ML PPV (MISCELLANEOUS) ×2 IMPLANT
COVER SURGICAL LIGHT HANDLE (MISCELLANEOUS) ×2 IMPLANT
DRAPE LAPAROSCOPIC ABDOMINAL (DRAPES) IMPLANT
DRAPE LAPAROTOMY 100X72 PEDS (DRAPES) IMPLANT
DRSG PAD ABDOMINAL 8X10 ST (GAUZE/BANDAGES/DRESSINGS) IMPLANT
ELECT CAUTERY BLADE 6.4 (BLADE) ×2 IMPLANT
ELECT REM PT RETURN 9FT ADLT (ELECTROSURGICAL) ×2
ELECTRODE REM PT RTRN 9FT ADLT (ELECTROSURGICAL) ×1 IMPLANT
GAUZE PACKING IODOFORM 1/2 (PACKING) ×1 IMPLANT
GAUZE SPONGE 4X4 12PLY STRL (GAUZE/BANDAGES/DRESSINGS) IMPLANT
GLOVE BIO SURGEON STRL SZ7 (GLOVE) ×2 IMPLANT
GLOVE BIOGEL PI IND STRL 7.5 (GLOVE) ×1 IMPLANT
GLOVE BIOGEL PI INDICATOR 7.5 (GLOVE) ×1
GOWN STRL REUS W/ TWL LRG LVL3 (GOWN DISPOSABLE) ×2 IMPLANT
GOWN STRL REUS W/TWL LRG LVL3 (GOWN DISPOSABLE) ×2
KIT BASIN OR (CUSTOM PROCEDURE TRAY) ×2 IMPLANT
KIT TURNOVER KIT B (KITS) ×2 IMPLANT
NS IRRIG 1000ML POUR BTL (IV SOLUTION) ×2 IMPLANT
PACK GENERAL/GYN (CUSTOM PROCEDURE TRAY) ×2 IMPLANT
PAD ABD 8X10 STRL (GAUZE/BANDAGES/DRESSINGS) ×1 IMPLANT
PAD ARMBOARD 7.5X6 YLW CONV (MISCELLANEOUS) ×2 IMPLANT
SWAB COLLECTION DEVICE MRSA (MISCELLANEOUS) IMPLANT
SWAB CULTURE ESWAB REG 1ML (MISCELLANEOUS) IMPLANT
TOWEL GREEN STERILE (TOWEL DISPOSABLE) ×2 IMPLANT
TOWEL GREEN STERILE FF (TOWEL DISPOSABLE) ×2 IMPLANT

## 2021-10-22 NOTE — Transfer of Care (Signed)
Immediate Anesthesia Transfer of Care Note ? ?Patient: Jeffery Patterson ? ?Procedure(s) Performed: INCISION AND DRAINAGE PERI-ANAL ABSCESS (Perineum) ? ?Patient Location: PACU ? ?Anesthesia Type:General ? ?Level of Consciousness: drowsy and patient cooperative ? ?Airway & Oxygen Therapy: Patient Spontanous Breathing ? ?Post-op Assessment: Report given to RN and Post -op Vital signs reviewed and stable ? ?Post vital signs: Reviewed and stable ? ?Last Vitals:  ?Vitals Value Taken Time  ?BP 117/79 10/22/21 1042  ?Temp    ?Pulse 77 10/22/21 1042  ?Resp 13 10/22/21 1042  ?SpO2 95 % 10/22/21 1042  ?Vitals shown include unvalidated device data. ? ?Last Pain:  ?Vitals:  ? 10/22/21 0821  ?TempSrc:   ?PainSc: 0-No pain  ?   ? ?  ? ?Complications: No notable events documented. ?

## 2021-10-22 NOTE — Interval H&P Note (Signed)
History and Physical Interval Note: ? ?10/22/2021 ?9:10 AM ?I have seen and examined patient. Plan for incision and drainage of abscess in OR today. Discussed packing and postop with him with aid of interpreter ?Jeffery Patterson  has presented today for surgery, with the diagnosis of Peri-anal abscess.  The various methods of treatment have been discussed with the patient and family. After consideration of risks, benefits and other options for treatment, the patient has consented to  Procedure(s): ?INCISION AND DRAINAGE PERI-ANAL ABSCESS (N/A) as a surgical intervention.  The patient's history has been reviewed, patient examined, no change in status, stable for surgery.  I have reviewed the patient's chart and labs.  Questions were answered to the patient's satisfaction.   ? ? ?Emelia Loron ? ? ?

## 2021-10-22 NOTE — Op Note (Signed)
Preoperative diagnosis: perianal abscess ?Postoperative diagnosis: Same as above ?Procedure: Incision and drainage of perianal abscess ?Surgeon: Dr. Harden Mo ?Anesthesia: General  ?Estimated blood loss: Minimal ?Complications: None ?Drains: None ?Specimens:cultures to micro ?Sponge needle count was correct at completion ?Decision to recovery stable condition ? ?Indications: 36 yom with perianal abscess. Discussed incision and drainage in OR. ? ?Procedure:  After informed consent was obtained he was taken to the OR.  He was given antibiotics.  SCDs were placed.  He was then placed under general anesthesia without complication.  He was placed in lithotomy and appropriately padded. He was prepped and draped in the standard sterile surgical fashion.  A surgical timeout was then performed. ? ?I made a cruciate incision overlying the abscess. There was return of a lot of purulence. Cultures were taken.  I broke all the loculations up with my finger .  I irrigated and then packed with iodoform.  He tolerated well, was extubated and transferred to recovery stable.  ?  ?  ?  ?  ? ?

## 2021-10-22 NOTE — Anesthesia Preprocedure Evaluation (Signed)
Anesthesia Evaluation  ?Patient identified by MRN, date of birth, ID band ?Patient awake ? ? ? ?Reviewed: ?Allergy & Precautions, NPO status , Patient's Chart, lab work & pertinent test results ? ?History of Anesthesia Complications ?Negative for: history of anesthetic complications ? ?Airway ?Mallampati: II ? ?TM Distance: >3 FB ?Neck ROM: Full ? ? ? Dental ? ?(+) Dental Advisory Given, Teeth Intact ?  ?Pulmonary ?Current Smoker,  ?  ?Pulmonary exam normal ? ? ? ? ? ? ? Cardiovascular ?negative cardio ROS ?Normal cardiovascular exam ? ? ?  ?Neuro/Psych ?negative neurological ROS ?   ? GI/Hepatic ?negative GI ROS, Neg liver ROS,   ?Endo/Other  ?negative endocrine ROS ? Renal/GU ?negative Renal ROS  ?negative genitourinary ?  ?Musculoskeletal ?negative musculoskeletal ROS ?(+)  ? Abdominal ?  ?Peds ? Hematology ?negative hematology ROS ?(+)   ?Anesthesia Other Findings ? ? Reproductive/Obstetrics ? ?  ? ? ? ? ? ? ? ? ? ? ? ? ? ?  ?  ? ? ? ? ? ? ?Anesthesia Physical ?Anesthesia Plan ? ?ASA: 2 ? ?Anesthesia Plan: General  ? ?Post-op Pain Management: Tylenol PO (pre-op)*  ? ?Induction: Intravenous ? ?PONV Risk Score and Plan: 1 and Ondansetron, Dexamethasone, Midazolam and Treatment may vary due to age or medical condition ? ?Airway Management Planned: LMA ? ?Additional Equipment: None ? ?Intra-op Plan:  ? ?Post-operative Plan: Extubation in OR ? ?Informed Consent: I have reviewed the patients History and Physical, chart, labs and discussed the procedure including the risks, benefits and alternatives for the proposed anesthesia with the patient or authorized representative who has indicated his/her understanding and acceptance.  ? ? ? ?Dental advisory given ? ?Plan Discussed with:  ? ?Anesthesia Plan Comments:   ? ? ? ? ? ?Anesthesia Quick Evaluation ? ?

## 2021-10-22 NOTE — Anesthesia Procedure Notes (Signed)
Procedure Name: LMA Insertion ?Date/Time: 10/22/2021 10:02 AM ?Performed by: Rosiland Oz, CRNA ?Pre-anesthesia Checklist: Patient identified, Emergency Drugs available, Suction available, Patient being monitored and Timeout performed ?Patient Re-evaluated:Patient Re-evaluated prior to induction ?Oxygen Delivery Method: Circle system utilized ?Preoxygenation: Pre-oxygenation with 100% oxygen ?Induction Type: IV induction ?LMA: LMA inserted ?LMA Size: 4.0 ?Number of attempts: 1 ?Placement Confirmation: positive ETCO2 and breath sounds checked- equal and bilateral ?Tube secured with: Tape ?Dental Injury: Teeth and Oropharynx as per pre-operative assessment  ? ? ? ? ?

## 2021-10-22 NOTE — Anesthesia Postprocedure Evaluation (Signed)
Anesthesia Post Note ? ?Patient: [name] ? ?Procedure(s) Performed: INCISION AND DRAINAGE PERI-ANAL ABSCESS (Perineum) ? ?  ? ?Patient location during evaluation: PACU ?Anesthesia Type: General ?Level of consciousness: awake and alert ?Pain management: pain level controlled ?Vital Signs Assessment: post-procedure vital signs reviewed and stable ?Respiratory status: spontaneous breathing, nonlabored ventilation and respiratory function stable ?Cardiovascular status: blood pressure returned to baseline and stable ?Postop Assessment: no apparent nausea or vomiting ?Anesthetic complications: no ? ? ?No notable events documented. ? ?Last Vitals:  ?Vitals:  ? 10/22/21 1125 10/22/21 1157  ?BP: 109/74 116/81  ?Pulse: 64 63  ?Resp: 13 16  ?Temp: (!) 36.4 ?C 36.6 ?C  ?SpO2: 95% 100%  ?  ?Last Pain:  ?Vitals:  ? 10/22/21 1201  ?TempSrc:   ?PainSc: 0-No pain  ? ? ?  ?  ?  ?  ?  ?  ? ?Jeffery Patterson ? ? ? ? ?

## 2021-10-23 ENCOUNTER — Other Ambulatory Visit (HOSPITAL_COMMUNITY): Payer: Self-pay

## 2021-10-23 ENCOUNTER — Encounter (HOSPITAL_COMMUNITY): Payer: Self-pay | Admitting: General Surgery

## 2021-10-23 MED ORDER — AMOXICILLIN-POT CLAVULANATE 875-125 MG PO TABS
1.0000 | ORAL_TABLET | Freq: Two times a day (BID) | ORAL | 0 refills | Status: AC
  Filled 2021-10-23: qty 10, 5d supply, fill #0

## 2021-10-23 MED ORDER — POLYETHYLENE GLYCOL 3350 17 G PO PACK: 17.0000 g | PACK | Freq: Every day | ORAL | Status: AC | PRN

## 2021-10-23 MED ORDER — TRAMADOL HCL 50 MG PO TABS
50.0000 mg | ORAL_TABLET | Freq: Four times a day (QID) | ORAL | 0 refills | Status: AC | PRN
  Filled 2021-10-23: qty 6, 2d supply, fill #0

## 2021-10-23 MED ORDER — DOCUSATE SODIUM 100 MG PO CAPS
100.0000 mg | ORAL_CAPSULE | Freq: Two times a day (BID) | ORAL | Status: AC | PRN
Start: 2021-10-23 — End: ?

## 2021-10-23 NOTE — Progress Notes (Signed)
Discharge instructions given with pt spouse at bedside. Meds delivered to bedside from pharmacy. Pt remains awake and alert at baseline. Transport via WC pending for DC. All belongings taken to vehicle by pt wife. ?

## 2021-10-23 NOTE — Discharge Summary (Signed)
[facility] ?Discharge Summary  ? ?Patient ID: ?Jeffery Patterson ?MRN: [id number] ?DOB/AGE: Feb 21, 1986 36 y.o. ? ?Admit date: 10/21/2021 ?Discharge date: 10/23/2021 ? ?Admitting Diagnosis: ?Perianal abscess [K61.0] ?Abscess [L02.91] ? ? ?Discharge Diagnosis ?Perianal abscess [K61.0] ?Abscess [L02.91] ?S/p incision and drainage of perianal abscess ? ?Consultants ?None  ? ?Imaging: ?CT PELVIS W CONTRAST ? ?Result Date: 10/21/2021 ?CLINICAL DATA:  Two week history of pain in gluteal region. Worse with bowel movement. Anorectal abscess. EXAM: CT PELVIS WITH CONTRAST TECHNIQUE: Multidetector CT imaging of the pelvis was performed using the standard protocol following the bolus administration of intravenous contrast. RADIATION DOSE REDUCTION: This exam was performed according to the departmental dose-optimization program which includes automated exposure control, adjustment of the mA and/or kV according to patient size and/or use of iterative reconstruction technique. CONTRAST:  62mL OMNIPAQUE IOHEXOL 300 MG/ML  SOLN COMPARISON:  Report 05/20/2007 FINDINGS: Urinary Tract:  No distal hydroureter.  Normal urinary bladder. Bowel: Minimal motion degradation superiorly. Normal small bowel, imaged terminal ileum, and imaged appendix. Moderate amount of stool in the rectum. No evidence of colitis or proctitis. An incompletely imaged peripherally enhancing fluid collection along the left gluteal crease measures 1.5 x 2.8 cm on 144/3. At least 2.6 cm craniocaudal on coronal image 84 (incompletely imaged). Likely tracks from the 12 to 1 o'clock position of the low anus including on 130/3 Vascular/Lymphatic: No pelvic aneurysm or sidewall adenopathy. Reproductive:  Normal prostate. Other:  No significant free fluid.  No free intraperitoneal air. Musculoskeletal: Probable bone island in the left iliac. Transitional S1 vertebral body. IMPRESSION: 1. 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 The imaging test of choice for further characterization is nonemergent dedicated high-resolution pre and post-contrast MRI. 2. No findings of colitis or proctitis.  Possible constipation. Electronically Signed   By: Jeronimo Greaves M.D.   On: 10/21/2021 18:09   ? ?Procedures ?Dr. Dwain Sarna (10/22/21) - incision and drainage of perianal abscess ? ?Hospital Course:  ?36 year old male who presented to Urgent care with perirectal pain and was sent to the [facility] ED for further evaluation.  Workup showed leukocytosis and perianal abscess.  Patient was admitted and underwent procedure listed above.  Tolerated procedure well and was transferred to the floor.  Diet was advanced as tolerated.  On POD1, the patient was voiding well, tolerating diet, ambulating well, pain well controlled, vital signs stable, incisions c/d/i and felt stable for discharge home. His packing was removed and left out. He was instructed on wound care at home including sitz baths. We discussed the risk of recurrence and signs/symptoms of this. Should he recur he will be referred to one of our colorectal surgeons. Patient will follow up in our office in 2-3 weeks and knows to call with questions or concerns.   ? ?He was given IV antibiotics during admission and will go home on 5 days augmentin with prescription for short course of pain medication provided. ? ?Physical Exam: ?General:  Alert, NAD, pleasant, comfortable ?Abd:  Soft, ND, NT ?GU: left perianal incision with packing. Removed with cloudy bloody drainage which patient tolerated well with mild pain. No induration or erythema ? ?I or a member of my team have reviewed this patient in the Controlled Substance Database. ? ?Due to language barrier, an interpreter was present during the history-taking and subsequent discussion (and for part of the physical exam) with  this patient. ? ? ? ?Allergies as of 10/23/2021   ?No  Known Allergies ?  ? ?  ?Medication List  ?  ? ?TAKE these medications   ? ?acetaminophen 500 MG tablet ?Commonly known as: TYLENOL ?Take 500 mg by mouth every 6 (six) hours as needed for mild pain. ?  ?amoxicillin-clavulanate 875-125 MG tablet ?Commonly known as: Augmentin ?Take 1 tablet by mouth 2 (two) times daily for 5 days. ?  ?docusate sodium 100 MG capsule ?Commonly known as: COLACE ?Take 1 capsule (100 mg total) by mouth 2 (two) times daily as needed for mild constipation. ?  ?polyethylene glycol 17 g packet ?Commonly known as: MIRALAX / GLYCOLAX ?Take 17 g by mouth daily as needed for severe constipation. ?  ?traMADol 50 MG tablet ?Commonly known as: Ultram ?Take 1 tablet (50 mg total) by mouth every 6 (six) hours as needed for up to 3 days for moderate pain or severe pain. ?  ? ?  ? ? ? ? Follow-up Information   ? ? [facility]. Schedule an appointment as soon as possible for a visit.   ?Contact information: ?[address] ?[place] [address] ?[phone] ? ?  ?  ? ? Maczis, Puja Gosai, PA-C. Go on 11/12/2021.   ?Specialty: General Surgery ?Why: follow up on 11/12/21 at 2:30 pm. please arrive 30 minutes early to complete check in process and bring photo ID and insurance card if you have them ? ?seguimiento el 05/09/22 a las 2:30 pm. llegue 30 minutos antes para completar el proceso de registro y [place] identificaci?n con foto y tarjeta de seguro si los tiene ?Contact information: ?[address] ?[address] ?[place] [address] ?[phone] ? ? ?  ?  ? ?  ?  ? ?  ? ? ?Signed: ?Eric Form , PA-C ?[facility] ?10/23/2021, 8:54 AM ?Please see Amion for pager number during day hours 7:00am-4:30pm ? ? ?\ ?

## 2021-10-23 NOTE — Discharge Instructions (Addendum)
Warm water baths (sitz baths) or ice packs (30-60 minutes up to 8 times a day, especially after bowel meovements) will help. Use ice for the first few days to help decrease swelling and bruising, then switch to heat such as warm towels, sitz baths, warm baths, warm showers, etc to help relax tight/sore spots and speed recovery.  Some people prefer to use ice alone, heat alone, alternating between ice & heat.  Experiment to what works for you.   ? ?It is helpful to take an over-the-counter pain medication continuously for the first few weeks.  Choose one of the following that works best for you: ?Naproxen (Aleve, etc)  Two 220mg  tabs twice a day ?Ibuprofen (Advil, etc) Three 200mg  tabs four times a day (every meal & bedtime) ?Acetaminophen (Tylenol, etc) 500-650mg  four times a day (every meal & bedtime) ? ?KEEP YOUR BOWELS REGULAR ?The goal is one soft bowel movement a day ?Avoid getting constipated.  Between the surgery and the pain medications, it is common to experience some constipation.  Increasing fluid intake and taking a fiber supplement (such as Metamucil, Citrucel, FiberCon, MiraLax, etc) 2-4 times a day regularly will usually help prevent this problem from occurring.  A mild laxative (prune juice, colace, Milk of Magnesia, MiraLax, etc) should be taken according to package directions if there are no bowel movements after 48 hours. ?Watch out for diarrhea.  If you have many loose bowel movements, simplify your diet to bland foods & liquids for a few days.  Stop any stool softeners and decrease your fiber supplement.  Switching to mild anti-diarrheal medications (Kayopectate, Pepto Bismol) can help.  Can try an imodium/loperamide dose.  If this worsens or does not improve, please call us. ? ?a. Los ba?os de agua tibia (ba?os de asiento) o bolsas de hielo (30 a 60 minutos hasta 8 veces al d?a, especialmente despu?s de las evacuaciones intestinales) ayudar?n. Use hielo durante los primeros d?as para ayudar a  disminuir la hinchaz?n y los moretones, luego cambie a Freight forwarder como toallas tibias, ba?os de asiento, ba?os tibios, duchas tibias, etc. para ayudar a relajar los puntos apretados/doloridos y Scientific laboratory technician recuperaci?n. Algunas Academic librarian solo hielo, solo calor, alternando entre hielo y Freight forwarder. Experimente lo que funcione para usted. ? ?b. Es ?til Financial controller un analg?sico de venta libre durante las primeras semanas. Elija uno de los siguientes que funcione mejor para usted: ?i. Naproxeno (Aleve, etc.) Dos tabletas de 220 mg dos veces al d?a ?ii. Ibuprofeno (Advil, etc.) Tres tabletas de 200 mg cuatro veces al d?a (cada comida y antes de [place]) ?iii. Acetaminof?n (Tylenol, etc.) 500-650 mg cuatro veces al d?a (cada comida y antes de [place]) ?IV. ? ?[place] ?a. El objetivo es una deposici?n blanda al d?a. ?b. Evite el estre?imiento. Entre la cirug?a y los analg?sicos, es com?n experimentar algo de estre?imiento. Aumentar la ingesta de l?quidos y tomar un suplemento de fibra (como Metamucil, Citrucel, FiberCon, [place], etc.) de 2 a 4 veces al d?a con regularidad generalmente ayudar? a prevenir que [employer]. Se debe tomar un laxante suave (jugo de ciruela, colace, [place] de magnesia, [place], etc.) de acuerdo con las instrucciones del paquete si no hay deposiciones despu?s de 48 horas. ?C. Cuidado con la diarrea. Si tiene muchas evacuaciones intestinales sueltas, simplifique su dieta a alimentos y l?quidos suaves durante unos d?as. Suspenda cualquier ablandador de heces y [place] su suplemento de [place]. Cambiar a medicamentos antidiarreicos suaves (Kayopectate, Pepto Bismol) puede ayudar. Puede probar  una dosis de imodium/loperamide. Si esto empeora o no mejora, por favor ll?menos. ?

## 2021-10-28 ENCOUNTER — Other Ambulatory Visit (HOSPITAL_COMMUNITY): Payer: Self-pay

## 2021-10-28 LAB — AEROBIC/ANAEROBIC CULTURE W GRAM STAIN (SURGICAL/DEEP WOUND)

## 2023-12-01 IMAGING — CT CT PELVIS W/ CM
2 of 4 series · 15 of 46 positions shown, 17 images · IV contrast (agent unspecified)
Comparison: Report 05/20/2007

CLINICAL DATA: Two week history of pain in gluteal region. Worse
with bowel movement. Anorectal abscess.

EXAM:
CT PELVIS WITH CONTRAST
TECHNIQUE: Multidetector CT imaging of the pelvis was performed using the
standard protocol following the bolus administration of intravenous
contrast.

[Series 8: coronal st · coronal · 0.60mm/px · 3 of 148 slices shown]
[im 50/148  soft-tissue]
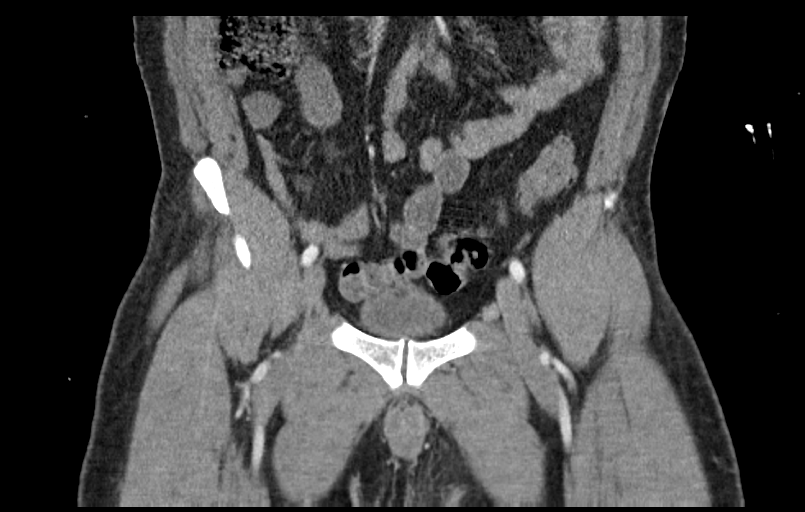
[im 66/148  soft-tissue]
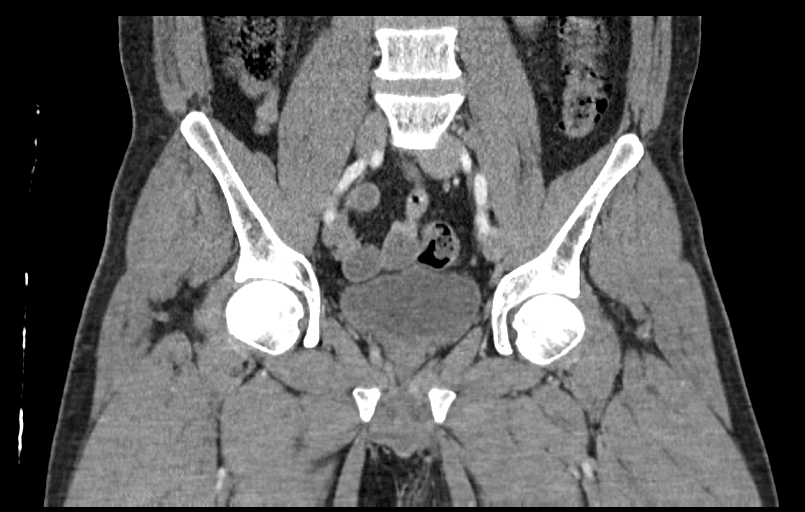
[im 82/148  soft-tissue]
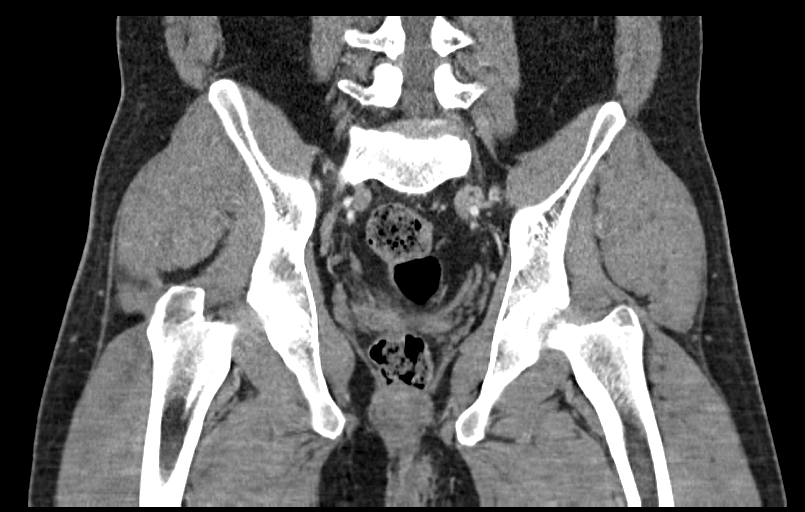

[Series 10: pelvis thin · axial · 0.93mm/px · z∈[+275,+538]mm · 12 of 479 slices shown, 14 images]
[im 20/479  soft-tissue]
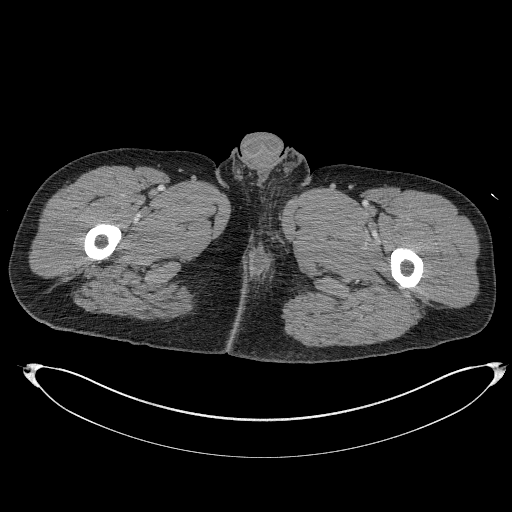
[im 20/479  bone]
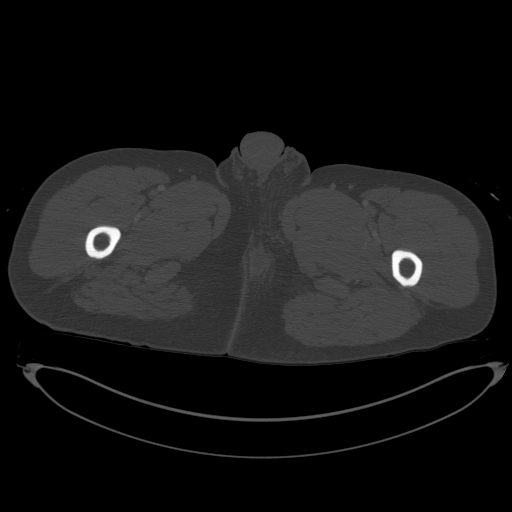
[im 60/479  soft-tissue]
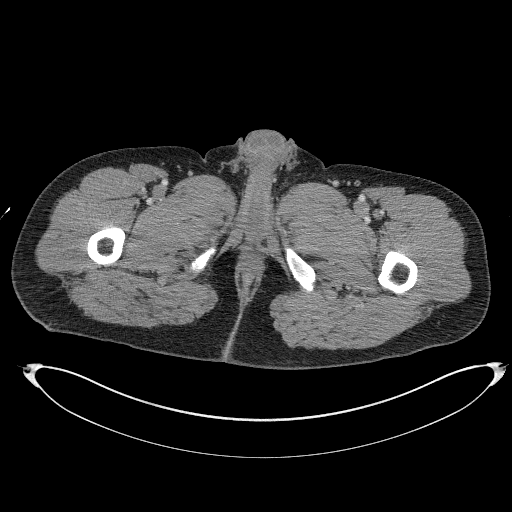
[im 100/479  soft-tissue]
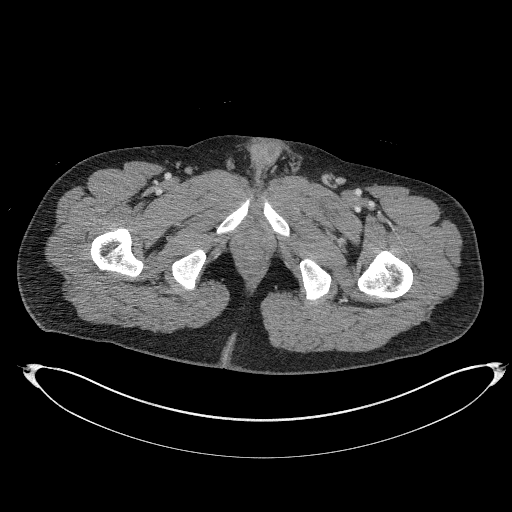
[im 140/479  soft-tissue]
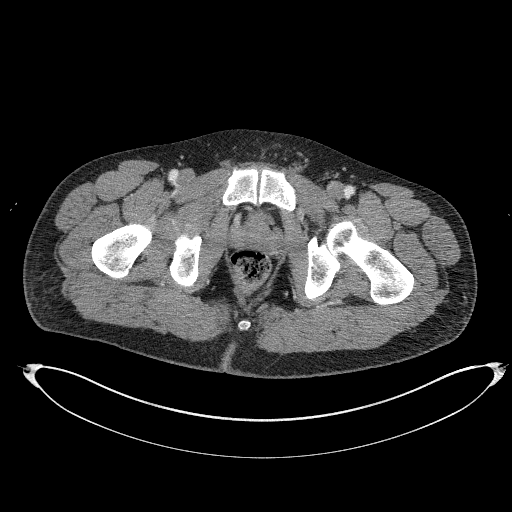
[im 180/479  soft-tissue]
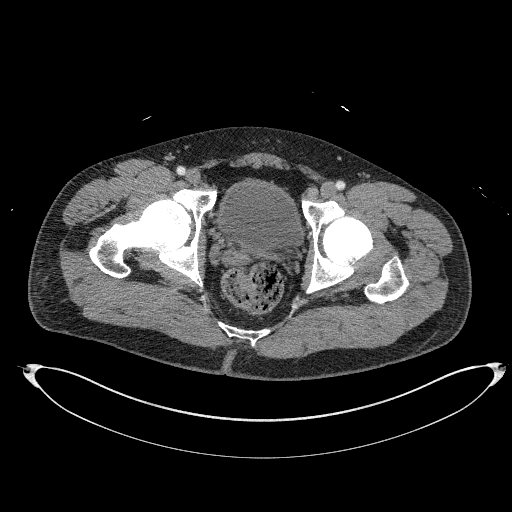
[im 220/479  soft-tissue]
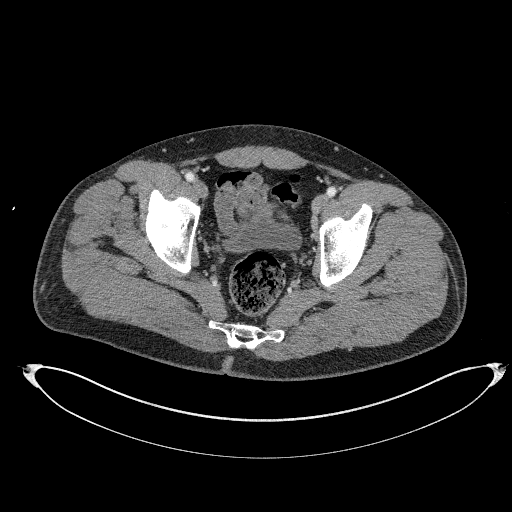
[im 259/479  soft-tissue]
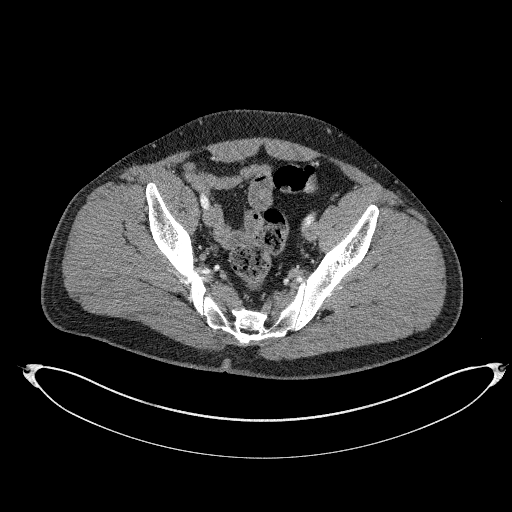
[im 299/479  soft-tissue]
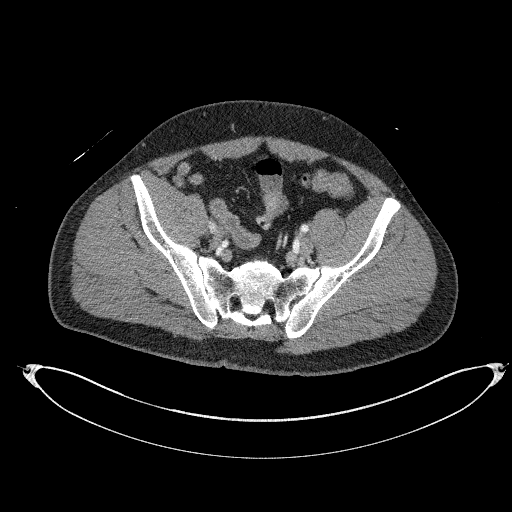
[im 339/479  soft-tissue]
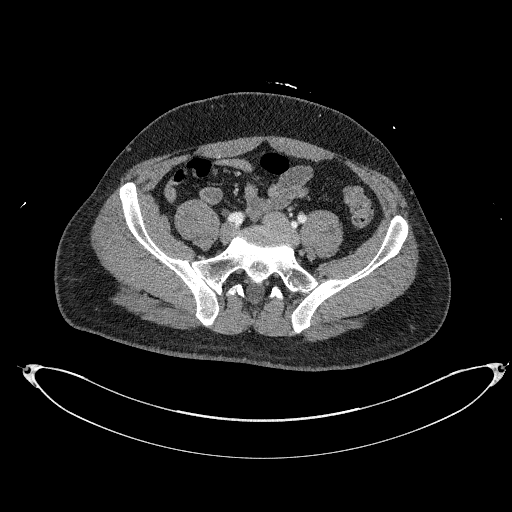
[im 339/479  bone]
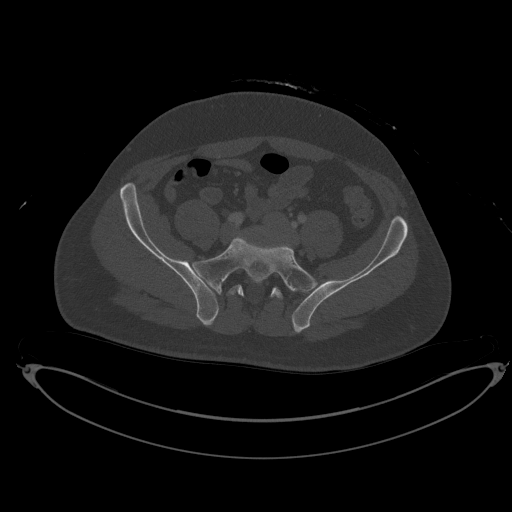
[im 379/479  soft-tissue]
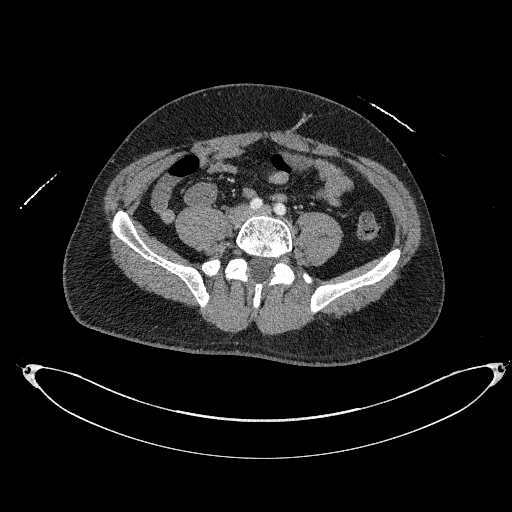
[im 419/479  soft-tissue]
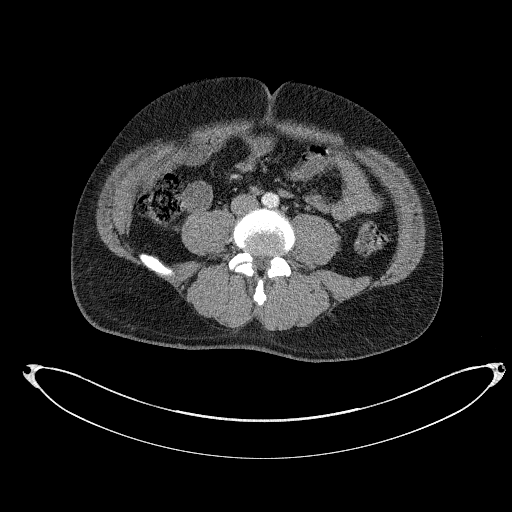
[im 459/479  soft-tissue]
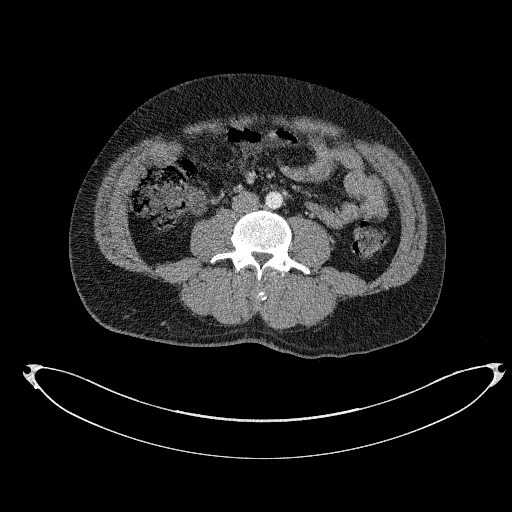

[15 of 46 positions shown; findings below may reference images not displayed]

RADIATION DOSE REDUCTION: This exam was performed according to the
departmental dose-optimization program which includes automated
exposure control, adjustment of the mA and/or kV according to
patient size and/or use of iterative reconstruction technique.

CONTRAST:  90mL OMNIPAQUE IOHEXOL 300 MG/ML  SOLN
FINDINGS: Urinary Tract:  No distal hydroureter.  Normal urinary bladder.

Bowel: Minimal motion degradation superiorly. Normal small bowel,
imaged terminal ileum, and imaged appendix.

Moderate amount of stool in the rectum. No evidence of colitis or
proctitis.

An incompletely imaged peripherally enhancing fluid collection along
the left gluteal crease measures 1.5 x 2.8 cm on 144/3. At least
cm craniocaudal on coronal image 84 (incompletely imaged). Likely
tracks from the 12 to 1 o'clock position of the low anus including
on 130/3

Vascular/Lymphatic: No pelvic aneurysm or sidewall adenopathy.

Reproductive:  Normal prostate.

Other:  No significant free fluid.  No free intraperitoneal air.

Musculoskeletal: Probable bone island in the left iliac.
Transitional S1 vertebral body.
IMPRESSION: 1. Incompletely imaged 1.5 x 2.8 x 2.6 cm left perianal abscess
secondary to a (likely transsphincteric) fistula arising from the 12
to 1 o'clock position of the anus. The imaging test of choice for
further characterization is nonemergent dedicated high-resolution
pre and post-contrast MRI.
2. No findings of colitis or proctitis.  Possible constipation.
